# Patient Record
Sex: Female | Born: 1960 | Race: White | Hispanic: No | Marital: Married | Smoking: Former smoker
Health system: Southern US, Community
[De-identification: ages and names within clinical notes are randomized; demographics above are authoritative.]

## PROBLEM LIST (undated history)

## (undated) DIAGNOSIS — R112 Nausea with vomiting, unspecified: Secondary | ICD-10-CM

## (undated) DIAGNOSIS — E559 Vitamin D deficiency, unspecified: Secondary | ICD-10-CM

## (undated) DIAGNOSIS — E669 Obesity, unspecified: Secondary | ICD-10-CM

## (undated) DIAGNOSIS — M1990 Unspecified osteoarthritis, unspecified site: Secondary | ICD-10-CM

## (undated) DIAGNOSIS — F419 Anxiety disorder, unspecified: Secondary | ICD-10-CM

## (undated) DIAGNOSIS — G4730 Sleep apnea, unspecified: Secondary | ICD-10-CM

## (undated) DIAGNOSIS — I2510 Atherosclerotic heart disease of native coronary artery without angina pectoris: Secondary | ICD-10-CM

## (undated) DIAGNOSIS — F32A Depression, unspecified: Secondary | ICD-10-CM

## (undated) DIAGNOSIS — D649 Anemia, unspecified: Secondary | ICD-10-CM

## (undated) DIAGNOSIS — T8859XA Other complications of anesthesia, initial encounter: Secondary | ICD-10-CM

## (undated) DIAGNOSIS — I10 Essential (primary) hypertension: Secondary | ICD-10-CM

## (undated) DIAGNOSIS — J45909 Unspecified asthma, uncomplicated: Secondary | ICD-10-CM

## (undated) DIAGNOSIS — N189 Chronic kidney disease, unspecified: Secondary | ICD-10-CM

## (undated) DIAGNOSIS — K219 Gastro-esophageal reflux disease without esophagitis: Secondary | ICD-10-CM

## (undated) HISTORY — PX: ABDOMINAL HYSTERECTOMY: SHX81

## (undated) HISTORY — PX: TUBAL LIGATION: SHX77

## (undated) HISTORY — PX: COLONOSCOPY: SHX174

## (undated) HISTORY — PX: CHOLECYSTECTOMY: SHX55

## (undated) HISTORY — PX: LAPAROSCOPIC GASTRIC SLEEVE RESECTION: SHX5895

---

## 2019-01-14 ENCOUNTER — Encounter (HOSPITAL_COMMUNITY)
Admission: EM | Disposition: A | Discharge status: Home / Self Care | Payer: Self-pay | Source: Home / Self Care | Attending: Emergency Medicine

## 2019-01-14 ENCOUNTER — Ambulatory Visit (HOSPITAL_COMMUNITY)
Admission: EM | Admit: 2019-01-14 | Discharge: 2019-01-15 | Disposition: A | Discharge status: Home / Self Care | Payer: Managed Care, Other (non HMO) | Attending: Emergency Medicine | Admitting: Emergency Medicine

## 2019-01-14 ENCOUNTER — Encounter (HOSPITAL_COMMUNITY): Payer: Self-pay | Admitting: *Deleted

## 2019-01-14 ENCOUNTER — Emergency Department (HOSPITAL_COMMUNITY): Payer: Managed Care, Other (non HMO) | Admitting: Certified Registered"

## 2019-01-14 ENCOUNTER — Emergency Department (HOSPITAL_COMMUNITY): Payer: Managed Care, Other (non HMO)

## 2019-01-14 DIAGNOSIS — F329 Major depressive disorder, single episode, unspecified: Secondary | ICD-10-CM | POA: Insufficient documentation

## 2019-01-14 DIAGNOSIS — S52601B Unspecified fracture of lower end of right ulna, initial encounter for open fracture type I or II: Secondary | ICD-10-CM | POA: Diagnosis not present

## 2019-01-14 DIAGNOSIS — Z6841 Body Mass Index (BMI) 40.0 and over, adult: Secondary | ICD-10-CM | POA: Diagnosis not present

## 2019-01-14 DIAGNOSIS — S52571B Other intraarticular fracture of lower end of right radius, initial encounter for open fracture type I or II: Secondary | ICD-10-CM | POA: Insufficient documentation

## 2019-01-14 DIAGNOSIS — Z23 Encounter for immunization: Secondary | ICD-10-CM | POA: Diagnosis not present

## 2019-01-14 DIAGNOSIS — S56221A Laceration of other flexor muscle, fascia and tendon at forearm level, right arm, initial encounter: Secondary | ICD-10-CM | POA: Insufficient documentation

## 2019-01-14 DIAGNOSIS — M25531 Pain in right wrist: Secondary | ICD-10-CM | POA: Diagnosis present

## 2019-01-14 DIAGNOSIS — S5401XA Injury of ulnar nerve at forearm level, right arm, initial encounter: Secondary | ICD-10-CM | POA: Insufficient documentation

## 2019-01-14 DIAGNOSIS — S6411XA Injury of median nerve at wrist and hand level of right arm, initial encounter: Secondary | ICD-10-CM | POA: Insufficient documentation

## 2019-01-14 DIAGNOSIS — S52501B Unspecified fracture of the lower end of right radius, initial encounter for open fracture type I or II: Secondary | ICD-10-CM

## 2019-01-14 DIAGNOSIS — S62101B Fracture of unspecified carpal bone, right wrist, initial encounter for open fracture: Secondary | ICD-10-CM

## 2019-01-14 HISTORY — PX: I & D EXTREMITY: SHX5045

## 2019-01-14 HISTORY — PX: ORIF WRIST FRACTURE: SHX2133

## 2019-01-14 HISTORY — DX: Obesity, unspecified: E66.9

## 2019-01-14 LAB — COMPREHENSIVE METABOLIC PANEL
ALT: 14 U/L (ref 0–44)
AST: 19 U/L (ref 15–41)
Albumin: 3 g/dL — ABNORMAL LOW (ref 3.5–5.0)
Alkaline Phosphatase: 66 U/L (ref 38–126)
Anion gap: 10 (ref 5–15)
BUN: 18 mg/dL (ref 6–20)
CO2: 24 mmol/L (ref 22–32)
Calcium: 8.6 mg/dL — ABNORMAL LOW (ref 8.9–10.3)
Chloride: 109 mmol/L (ref 98–111)
Creatinine, Ser: 1.16 mg/dL — ABNORMAL HIGH (ref 0.44–1.00)
GFR calc Af Amer: >60 mL/min (ref >60)
GFR calc non Af Amer: 52 mL/min — ABNORMAL LOW (ref >60)
Glucose, Bld: 141 mg/dL — ABNORMAL HIGH (ref 70–99)
Potassium: 4.1 mmol/L (ref 3.5–5.1)
Sodium: 143 mmol/L (ref 135–145)
TOTAL PROTEIN: 6.1 g/dL — AB (ref 6.5–8.1)
Total Bilirubin: 0.8 mg/dL (ref 0.3–1.2)

## 2019-01-14 LAB — CBC
HCT: 44.3 % (ref 36.0–46.0)
Hemoglobin: 14 g/dL (ref 12.0–15.0)
MCH: 28.7 pg (ref 26.0–34.0)
MCHC: 31.6 g/dL (ref 30.0–36.0)
MCV: 91 fL (ref 80.0–100.0)
Platelets: 294 10*3/uL (ref 150–400)
RBC: 4.87 MIL/uL (ref 3.87–5.11)
RDW: 13.2 % (ref 11.5–15.5)
WBC: 10.9 10*3/uL — ABNORMAL HIGH (ref 4.0–10.5)
nRBC: 0 % (ref 0.0–0.2)

## 2019-01-14 LAB — ETHANOL

## 2019-01-14 LAB — SAMPLE TO BLOOD BANK

## 2019-01-14 LAB — PROTIME-INR
INR: 0.93
Prothrombin Time: 12.4 seconds (ref 11.4–15.2)

## 2019-01-14 LAB — LACTIC ACID, PLASMA: Lactic Acid, Venous: 2.8 mmol/L (ref 0.5–1.9)

## 2019-01-14 SURGERY — OPEN REDUCTION INTERNAL FIXATION (ORIF) WRIST FRACTURE
Anesthesia: Regional | Site: Wrist | Laterality: Right

## 2019-01-14 MED ORDER — PROPOFOL 10 MG/ML IV BOLUS
INTRAVENOUS | Status: DC | PRN
  Administered 2019-01-14: 170 mg via INTRAVENOUS

## 2019-01-14 MED ORDER — BUPIVACAINE-EPINEPHRINE (PF) 0.5% -1:200000 IJ SOLN
INTRAMUSCULAR | Status: DC | PRN
  Administered 2019-01-14: 30 mL via PERINEURAL

## 2019-01-14 MED ORDER — HYDROMORPHONE HCL 1 MG/ML IJ SOLN
1.0000 mg | Freq: Once | INTRAMUSCULAR | Status: AC
  Administered 2019-01-14: 1 mg via INTRAVENOUS
  Filled 2019-01-14: qty 1

## 2019-01-14 MED ORDER — PHENYLEPHRINE HCL 10 MG/ML IJ SOLN
INTRAMUSCULAR | Status: DC | PRN
  Administered 2019-01-14 (×3): 120 ug via INTRAVENOUS

## 2019-01-14 MED ORDER — FENTANYL CITRATE (PF) 100 MCG/2ML IJ SOLN: 25.0000 ug | INTRAMUSCULAR | Status: DC | PRN

## 2019-01-14 MED ORDER — TETANUS-DIPHTH-ACELL PERTUSSIS 5-2.5-18.5 LF-MCG/0.5 IM SUSP
0.5000 mL | Freq: Once | INTRAMUSCULAR | Status: AC
  Administered 2019-01-14: 0.5 mL via INTRAMUSCULAR
  Filled 2019-01-14: qty 0.5

## 2019-01-14 MED ORDER — FENTANYL CITRATE (PF) 250 MCG/5ML IJ SOLN
INTRAMUSCULAR | Status: AC
  Filled 2019-01-14: qty 5

## 2019-01-14 MED ORDER — OXYCODONE HCL 5 MG/5ML PO SOLN: 5.0000 mg | Freq: Once | ORAL | Status: DC | PRN

## 2019-01-14 MED ORDER — CEFAZOLIN SODIUM-DEXTROSE 2-4 GM/100ML-% IV SOLN
2.0000 g | Freq: Once | INTRAVENOUS | Status: AC
  Administered 2019-01-14: 2 g via INTRAVENOUS

## 2019-01-14 MED ORDER — GLYCOPYRROLATE 0.2 MG/ML IJ SOLN
INTRAMUSCULAR | Status: DC | PRN
  Administered 2019-01-14: 0.2 mg via INTRAVENOUS

## 2019-01-14 MED ORDER — OXYCODONE HCL 5 MG PO TABS: 5.0000 mg | ORAL_TABLET | Freq: Once | ORAL | Status: DC | PRN

## 2019-01-14 MED ORDER — LACTATED RINGERS IV BOLUS
1500.0000 mL | Freq: Once | INTRAVENOUS | Status: AC
  Administered 2019-01-14: 1500 mL via INTRAVENOUS

## 2019-01-14 MED ORDER — EPHEDRINE SULFATE-NACL 50-0.9 MG/10ML-% IV SOSY
PREFILLED_SYRINGE | INTRAVENOUS | Status: DC | PRN
  Administered 2019-01-14 (×3): 10 mg via INTRAVENOUS

## 2019-01-14 MED ORDER — SUCCINYLCHOLINE CHLORIDE 200 MG/10ML IV SOSY
PREFILLED_SYRINGE | INTRAVENOUS | Status: DC | PRN
  Administered 2019-01-14: 120 mg via INTRAVENOUS

## 2019-01-14 MED ORDER — LACTATED RINGERS IV SOLN
INTRAVENOUS | Status: DC
  Administered 2019-01-14 (×2): via INTRAVENOUS

## 2019-01-14 MED ORDER — CEFAZOLIN SODIUM-DEXTROSE 2-4 GM/100ML-% IV SOLN
2.0000 g | Freq: Once | INTRAVENOUS | Status: AC
  Administered 2019-01-14: 2 g via INTRAVENOUS
  Filled 2019-01-14: qty 100

## 2019-01-14 MED ORDER — LIDOCAINE 2% (20 MG/ML) 5 ML SYRINGE
INTRAMUSCULAR | Status: DC | PRN
  Administered 2019-01-14: 60 mg via INTRAVENOUS

## 2019-01-14 MED ORDER — LACTATED RINGERS IV BOLUS: 1000.0000 mL | Freq: Once | INTRAVENOUS | Status: DC

## 2019-01-14 MED ORDER — MIDAZOLAM HCL 2 MG/2ML IJ SOLN
INTRAMUSCULAR | Status: AC
  Filled 2019-01-14: qty 2

## 2019-01-14 MED ORDER — PROPOFOL 10 MG/ML IV BOLUS
INTRAVENOUS | Status: AC
  Filled 2019-01-14: qty 20

## 2019-01-14 MED ORDER — SODIUM CHLORIDE 0.9 % IV SOLN
INTRAVENOUS | Status: DC | PRN
  Administered 2019-01-14: 50 ug/min via INTRAVENOUS

## 2019-01-14 MED ORDER — ONDANSETRON HCL 4 MG/2ML IJ SOLN
INTRAMUSCULAR | Status: DC | PRN
  Administered 2019-01-14: 4 mg via INTRAVENOUS

## 2019-01-14 MED ORDER — 0.9 % SODIUM CHLORIDE (POUR BTL) OPTIME
TOPICAL | Status: DC | PRN
  Administered 2019-01-14 (×2): 1000 mL

## 2019-01-14 MED ORDER — ONDANSETRON HCL 4 MG/2ML IJ SOLN
4.0000 mg | Freq: Four times a day (QID) | INTRAMUSCULAR | Status: AC | PRN
  Administered 2019-01-15: 4 mg via INTRAVENOUS

## 2019-01-14 MED ORDER — DULOXETINE HCL 30 MG PO CPEP: 30.0000 mg | ORAL_CAPSULE | Freq: Every day | ORAL | Status: DC

## 2019-01-14 MED ORDER — FENTANYL CITRATE (PF) 100 MCG/2ML IJ SOLN
INTRAMUSCULAR | Status: DC | PRN
  Administered 2019-01-14: 25 ug via INTRAVENOUS

## 2019-01-14 MED ORDER — BUPIVACAINE HCL (PF) 0.25 % IJ SOLN
INTRAMUSCULAR | Status: AC
  Filled 2019-01-14: qty 30

## 2019-01-14 MED ORDER — MIDAZOLAM HCL 5 MG/5ML IJ SOLN
INTRAMUSCULAR | Status: DC | PRN
  Administered 2019-01-14: 2 mg via INTRAVENOUS

## 2019-01-14 SURGICAL SUPPLY — 72 items
BANDAGE ACE 3X5.8 VEL STRL LF (GAUZE/BANDAGES/DRESSINGS) ×8 IMPLANT
BANDAGE ACE 4X5 VEL STRL LF (GAUZE/BANDAGES/DRESSINGS) ×6 IMPLANT
BIT DRILL 2.2 SS TIBIAL (BIT) ×2 IMPLANT
BLADE CLIPPER SURG (BLADE) IMPLANT
BNDG ESMARK 4X9 LF (GAUZE/BANDAGES/DRESSINGS) ×2 IMPLANT
BNDG GAUZE ELAST 4 BULKY (GAUZE/BANDAGES/DRESSINGS) ×2 IMPLANT
CORDS BIPOLAR (ELECTRODE) ×2 IMPLANT
COVER SURGICAL LIGHT HANDLE (MISCELLANEOUS) ×2 IMPLANT
COVER WAND RF STERILE (DRAPES) ×2 IMPLANT
CUFF TOURNIQUET SINGLE 18IN (TOURNIQUET CUFF) IMPLANT
CUFF TOURNIQUET SINGLE 24IN (TOURNIQUET CUFF) ×2 IMPLANT
DRAIN TLS ROUND 10FR (DRAIN) IMPLANT
DRAPE OEC MINIVIEW 54X84 (DRAPES) ×2 IMPLANT
DRAPE SURG 17X11 SM STRL (DRAPES) ×2 IMPLANT
DRIVER BIT SQUARE 1.7/2.2 (TRAUMA) ×4 IMPLANT
DRSG ADAPTIC 3X8 NADH LF (GAUZE/BANDAGES/DRESSINGS) ×2 IMPLANT
ELECT REM PT RETURN 9FT ADLT (ELECTROSURGICAL)
ELECTRODE REM PT RTRN 9FT ADLT (ELECTROSURGICAL) IMPLANT
GAUZE SPONGE 4X4 12PLY STRL (GAUZE/BANDAGES/DRESSINGS) ×2 IMPLANT
GAUZE XEROFORM 5X9 LF (GAUZE/BANDAGES/DRESSINGS) ×2 IMPLANT
GLOVE BIOGEL PI IND STRL 8.5 (GLOVE) ×1 IMPLANT
GLOVE BIOGEL PI INDICATOR 8.5 (GLOVE) ×1
GLOVE SURG ORTHO 8.0 STRL STRW (GLOVE) ×2 IMPLANT
GOWN STRL REUS W/ TWL LRG LVL3 (GOWN DISPOSABLE) ×1 IMPLANT
GOWN STRL REUS W/ TWL XL LVL3 (GOWN DISPOSABLE) ×1 IMPLANT
GOWN STRL REUS W/TWL LRG LVL3 (GOWN DISPOSABLE) ×1
GOWN STRL REUS W/TWL XL LVL3 (GOWN DISPOSABLE) ×1
HOVERMATT SINGLE USE (MISCELLANEOUS) ×2 IMPLANT
K-WIRE 1.6 (WIRE) ×2
K-WIRE FX5X1.6XNS BN SS (WIRE) ×2
KIT BASIN OR (CUSTOM PROCEDURE TRAY) ×2 IMPLANT
KIT TURNOVER KIT B (KITS) ×2 IMPLANT
KWIRE FX5X1.6XNS BN SS (WIRE) ×2 IMPLANT
MANIFOLD NEPTUNE II (INSTRUMENTS) ×2 IMPLANT
NEEDLE HYPO 25X1 1.5 SAFETY (NEEDLE) IMPLANT
NS IRRIG 1000ML POUR BTL (IV SOLUTION) ×2 IMPLANT
PACK ORTHO EXTREMITY (CUSTOM PROCEDURE TRAY) ×2 IMPLANT
PAD ARMBOARD 7.5X6 YLW CONV (MISCELLANEOUS) ×4 IMPLANT
PAD CAST 3X4 CTTN HI CHSV (CAST SUPPLIES) ×2 IMPLANT
PAD CAST 4YDX4 CTTN HI CHSV (CAST SUPPLIES) IMPLANT
PADDING CAST COTTON 3X4 STRL (CAST SUPPLIES) ×2
PADDING CAST COTTON 4X4 STRL (CAST SUPPLIES)
PEG LOCKING SMOOTH 2.2X16 (Screw) ×2 IMPLANT
PEG LOCKING SMOOTH 2.2X18 (Peg) ×4 IMPLANT
PEG LOCKING SMOOTH 2.2X22 (Screw) ×2 IMPLANT
PLATE RIGHT VOLAR RIM DVR (Plate) ×2 IMPLANT
SCREW LOCK 14X2.7X 3 LD TPR (Screw) ×3 IMPLANT
SCREW LOCK 16X2.7X 3 LD TPR (Screw) ×2 IMPLANT
SCREW LOCK 20X2.7X 3 LD TPR (Screw) ×1 IMPLANT
SCREW LOCK 22X2.7X 3 LD TPR (Screw) ×1 IMPLANT
SCREW LOCK 24X2.7X3 LD THRD (Screw) ×1 IMPLANT
SCREW LOCKING 2.7X14 (Screw) ×3 IMPLANT
SCREW LOCKING 2.7X16 (Screw) ×2 IMPLANT
SCREW LOCKING 2.7X20MM (Screw) ×1 IMPLANT
SCREW LOCKING 2.7X22MM (Screw) ×1 IMPLANT
SCREW LOCKING 2.7X24MM (Screw) ×1 IMPLANT
SLING ARM FOAM STRAP XLG (SOFTGOODS) ×4 IMPLANT
SOAP 2 % CHG 4 OZ (WOUND CARE) IMPLANT
SPLINT FIBERGLASS 3X35 (CAST SUPPLIES) ×2 IMPLANT
SUT FIBERWIRE 4-0 18 TAPR NDL (SUTURE) ×2
SUT MNCRL AB 3-0 PS2 18 (SUTURE) ×4 IMPLANT
SUT PROLENE 3 0 PS 2 (SUTURE) ×4 IMPLANT
SUT PROLENE 4 0 PS 2 18 (SUTURE) IMPLANT
SUT VIC AB 2-0 FS1 27 (SUTURE) IMPLANT
SUT VICRYL 4-0 PS2 18IN ABS (SUTURE) IMPLANT
SUTURE FIBERWR 4-0 18 TAPR NDL (SUTURE) ×1 IMPLANT
SYR CONTROL 10ML LL (SYRINGE) IMPLANT
SYSTEM CHEST DRAIN TLS 7FR (DRAIN) IMPLANT
TOWEL OR 17X24 6PK STRL BLUE (TOWEL DISPOSABLE) ×2 IMPLANT
TOWEL OR 17X26 10 PK STRL BLUE (TOWEL DISPOSABLE) ×2 IMPLANT
TUBE CONNECTING 12X1/4 (SUCTIONS) ×2 IMPLANT
WATER STERILE IRR 1000ML POUR (IV SOLUTION) IMPLANT

## 2019-01-14 NOTE — Anesthesia Preprocedure Evaluation (Signed)
Anesthesia Evaluation  Patient identified by MRN, date of birth, ID band Patient awake    Reviewed: Allergy & Precautions, H&P , NPO status , Patient's Chart, lab work & pertinent test results  Airway Mallampati: II   Neck ROM: full    Dental   Pulmonary neg pulmonary ROS,    breath sounds clear to auscultation       Cardiovascular negative cardio ROS   Rhythm:regular Rate:Normal     Neuro/Psych    GI/Hepatic   Endo/Other  Morbid obesity  Renal/GU      Musculoskeletal   Abdominal   Peds  Hematology   Anesthesia Other Findings   Reproductive/Obstetrics                             Anesthesia Physical Anesthesia Plan  ASA: II  Anesthesia Plan: General and Regional   Post-op Pain Management:  Regional for Post-op pain   Induction: Intravenous  PONV Risk Score and Plan: 3 and Ondansetron, Dexamethasone, Midazolam and Treatment may vary due to age or medical condition  Airway Management Planned: Oral ETT  Additional Equipment:   Intra-op Plan:   Post-operative Plan: Extubation in OR  Informed Consent: I have reviewed the patients History and Physical, chart, labs and discussed the procedure including the risks, benefits and alternatives for the proposed anesthesia with the patient or authorized representative who has indicated his/her understanding and acceptance.       Plan Discussed with: CRNA, Anesthesiologist and Surgeon  Anesthesia Plan Comments:         Anesthesia Quick Evaluation

## 2019-01-14 NOTE — Transfer of Care (Signed)
Immediate Anesthesia Transfer of Care Note  Patient: Megan Wang  Procedure(s) Performed: OPEN REDUCTION INTERNAL FIXATION (ORIF) WRIST FRACTURE (Right Wrist) IRRIGATION AND DEBRIDEMENT OF RIGHT WRIST FRACTURE (Right Wrist)  Patient Location: PACU  Anesthesia Type:General  Level of Consciousness: awake, alert  and oriented  Airway & Oxygen Therapy: Patient Spontanous Breathing  Post-op Assessment: Report given to RN and Post -op Vital signs reviewed and stable  Post vital signs: Reviewed and stable  Last Vitals:  Vitals Value Taken Time  BP 128/79 01/14/2019 11:42 PM  Temp 36.7 C 01/14/2019 11:42 PM  Pulse 103 01/14/2019 11:44 PM  Resp 15 01/14/2019 11:44 PM  SpO2 93 % 01/14/2019 11:44 PM  Vitals shown include unvalidated device data.  Last Pain:  Vitals:   01/14/19 1948  TempSrc: Tympanic  PainSc:          Complications: No apparent anesthesia complications
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
## 2019-01-14 NOTE — Anesthesia Procedure Notes (Signed)
Procedure Name: Intubation Date/Time: 01/14/2019 9:35 PM Performed by: Babs Bertin, CRNA Pre-anesthesia Checklist: Patient identified, Emergency Drugs available, Suction available and Patient being monitored Patient Re-evaluated:Patient Re-evaluated prior to induction Oxygen Delivery Method: Circle System Utilized Preoxygenation: Pre-oxygenation with 100% oxygen Induction Type: IV induction, Rapid sequence and Cricoid Pressure applied Laryngoscope Size: Mac Grade View: Grade I Tube type: Oral Tube size: 7.5 mm Number of attempts: 1 Airway Equipment and Method: Stylet and Oral airway Placement Confirmation: ETT inserted through vocal cords under direct vision,  positive ETCO2 and breath sounds checked- equal and bilateral Secured at: 21 cm Tube secured with: Tape Dental Injury: Teeth and Oropharynx as per pre-operative assessment

## 2019-01-14 NOTE — Anesthesia Procedure Notes (Signed)
Anesthesia Regional Block: Supraclavicular block   Pre-Anesthetic Checklist: ,, timeout performed, Correct Patient, Correct Site, Correct Laterality, Correct Procedure, Correct Position, site marked, Risks and benefits discussed,  Surgical consent,  Pre-op evaluation,  At surgeon's request and post-op pain management  Laterality: Right  Prep: chloraprep       Needles:  Injection technique: Single-shot  Needle Type: Echogenic Stimulator Needle     Needle Length: 5cm  Needle Gauge: 22     Additional Needles:   Procedures:, nerve stimulator,,,,,,,   Nerve Stimulator or Paresthesia:  Response: biceps flexion, 0.45 mA,   Additional Responses:   Narrative:  Start time: 01/14/2019 9:17 PM End time: 01/14/2019 9:24 PM Injection made incrementally with aspirations every 5 mL.  Performed by: Personally  Anesthesiologist: Achille Rich, MD  Additional Notes: Functioning IV was confirmed and monitors were applied.  A 76mm 22ga Arrow echogenic stimulator needle was used. Sterile prep and drape,hand hygiene and sterile gloves were used.  Negative aspiration and negative test dose prior to incremental administration of local anesthetic. The patient tolerated the procedure well.  Ultrasound guidance: relevent anatomy identified, needle position confirmed, local anesthetic spread visualized around nerve(s), vascular puncture avoided.  Image printed for medical record.
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
## 2019-01-15 ENCOUNTER — Other Ambulatory Visit: Payer: Self-pay

## 2019-01-15 MED ORDER — ONDANSETRON HCL 4 MG/2ML IJ SOLN: 4.0000 mg | Freq: Four times a day (QID) | INTRAMUSCULAR | Status: DC | PRN

## 2019-01-15 MED ORDER — DOCUSATE SODIUM 100 MG PO CAPS
100.0000 mg | ORAL_CAPSULE | Freq: Two times a day (BID) | ORAL | Status: DC
  Administered 2019-01-15: 100 mg via ORAL
  Filled 2019-01-15: qty 1

## 2019-01-15 MED ORDER — HYDROMORPHONE HCL 1 MG/ML IJ SOLN: 0.5000 mg | INTRAMUSCULAR | Status: DC | PRN

## 2019-01-15 MED ORDER — CEPHALEXIN 500 MG PO CAPS: 500.0000 mg | ORAL_CAPSULE | Freq: Four times a day (QID) | ORAL | 0 refills | Status: AC

## 2019-01-15 MED ORDER — CEFAZOLIN SODIUM-DEXTROSE 1-4 GM/50ML-% IV SOLN: 1.0000 g | INTRAVENOUS | Status: DC

## 2019-01-15 MED ORDER — ADULT MULTIVITAMIN W/MINERALS CH
1.0000 | ORAL_TABLET | Freq: Every day | ORAL | Status: DC
  Administered 2019-01-15: 1 via ORAL
  Filled 2019-01-15: qty 1

## 2019-01-15 MED ORDER — OXYCODONE HCL 5 MG PO TABS: 5.0000 mg | ORAL_TABLET | ORAL | Status: DC | PRN

## 2019-01-15 MED ORDER — METHOCARBAMOL 1000 MG/10ML IJ SOLN: 500.0000 mg | Freq: Four times a day (QID) | INTRAVENOUS | Status: DC | PRN

## 2019-01-15 MED ORDER — VITAMIN C 500 MG PO TABS
1000.0000 mg | ORAL_TABLET | Freq: Every day | ORAL | Status: DC
  Administered 2019-01-15: 1000 mg via ORAL
  Filled 2019-01-15: qty 2

## 2019-01-15 MED ORDER — ONDANSETRON HCL 4 MG PO TABS: 4.0000 mg | ORAL_TABLET | Freq: Four times a day (QID) | ORAL | Status: DC | PRN

## 2019-01-15 MED ORDER — GENTAMICIN SULFATE 40 MG/ML IJ SOLN
7.0000 mg/kg | INTRAVENOUS | Status: DC
  Administered 2019-01-15: 610 mg via INTRAVENOUS
  Filled 2019-01-15: qty 15.25

## 2019-01-15 MED ORDER — METHOCARBAMOL 500 MG PO TABS: 500.0000 mg | ORAL_TABLET | Freq: Four times a day (QID) | ORAL | Status: DC | PRN

## 2019-01-15 MED ORDER — CEFAZOLIN SODIUM-DEXTROSE 1-4 GM/50ML-% IV SOLN
1.0000 g | Freq: Three times a day (TID) | INTRAVENOUS | Status: DC
  Administered 2019-01-15: 1 g via INTRAVENOUS
  Filled 2019-01-15 (×2): qty 50

## 2019-01-15 MED ORDER — HYDROCODONE-ACETAMINOPHEN 10-325 MG PO TABS: 1.0000 | ORAL_TABLET | ORAL | Status: DC | PRN

## 2019-01-15 MED ORDER — DIPHENHYDRAMINE HCL 25 MG PO CAPS: 25.0000 mg | ORAL_CAPSULE | Freq: Four times a day (QID) | ORAL | Status: DC | PRN

## 2019-01-15 MED ORDER — OXYCODONE-ACETAMINOPHEN 10-325 MG PO TABS: 1.0000 | ORAL_TABLET | ORAL | 0 refills | Status: DC | PRN

## 2019-01-15 MED ORDER — ONDANSETRON HCL 4 MG/2ML IJ SOLN
INTRAMUSCULAR | Status: AC
  Filled 2019-01-15: qty 2

## 2019-01-15 MED ORDER — DOCUSATE SODIUM 100 MG PO CAPS: 100.0000 mg | ORAL_CAPSULE | Freq: Two times a day (BID) | ORAL | 0 refills | Status: AC

## 2019-01-15 NOTE — Progress Notes (Signed)
Pt given prescription and discharge instructions. Instructions gone over with her and family present, answered all questions. All belongings gathered to be sent home.
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
## 2019-01-15 NOTE — Progress Notes (Signed)
Orthopedics Progress Note  Subjective: My hand is numb this morning. No pain at all.  Objective:  Vitals:   01/15/19 0040 01/15/19 0904  BP: (!) 145/68 129/60  Pulse: 91 92  Resp:  16  Temp: (!) 97.4 F (36.3 C) (!) 97.5 F (36.4 C)  SpO2: 95% 96%    General: Awake and alert  Musculoskeletal: right arm splinted and elevated. Fingers well perfused and warm No motor and no sensory  Lab Results  Component Value Date   WBC 10.9 (H) 01/14/2019   HGB 14.0 01/14/2019   HCT 44.3 01/14/2019   MCV 91.0 01/14/2019   PLT 294 01/14/2019       Component Value Date/Time   NA 143 01/14/2019 1934   K 4.1 01/14/2019 1934   CL 109 01/14/2019 1934   CO2 24 01/14/2019 1934   GLUCOSE 141 (H) 01/14/2019 1934   BUN 18 01/14/2019 1934   CREATININE 1.16 (H) 01/14/2019 1934   CALCIUM 8.6 (L) 01/14/2019 1934   GFRNONAA 52 (L) 01/14/2019 1934   GFRAA >60 01/14/2019 1934    Lab Results  Component Value Date   INR 0.93 01/14/2019    Assessment/Plan: POD #1 s/p Procedure(s): OPEN REDUCTION INTERNAL FIXATION (ORIF) WRIST FRACTURE IRRIGATION AND DEBRIDEMENT OF RIGHT WRIST FRACTURE Stable this morning. Continue arm elevation and abx.   Almedia BallsSteven R. Ranell PatrickNorris, MD 01/15/2019 9:34 AM

## 2019-01-15 NOTE — Plan of Care (Signed)
  Problem: Health Behavior/Discharge Planning: Goal: Ability to manage health-related needs will improve Outcome: Adequate for Discharge   Problem: Pain Managment: Goal: General experience of comfort will improve Outcome: Adequate for Discharge   Problem: Safety: Goal: Ability to remain free from injury will improve Outcome: Adequate for Discharge   

## 2019-01-15 NOTE — Discharge Instructions (Signed)
KEEP BANDAGE CLEAN AND DRY CALL OFFICE FOR F/U APPT [phone] in 10 days Dr Melvyn Novas [phone] KEEP HAND ELEVATED ABOVE HEART OK TO APPLY ICE TO OPERATIVE AREA CONTACT OFFICE IF ANY WORSENING PAIN OR CONCERNS.

## 2019-01-16 NOTE — Anesthesia Postprocedure Evaluation (Signed)
Anesthesia Post Note  Patient: Megan Wang  Procedure(s) Performed: OPEN REDUCTION INTERNAL FIXATION (ORIF) WRIST FRACTURE (Right Wrist) IRRIGATION AND DEBRIDEMENT OF RIGHT WRIST FRACTURE (Right Wrist)     Patient location during evaluation: PACU Anesthesia Type: Regional and General Level of consciousness: awake and alert Pain management: pain level controlled Vital Signs Assessment: post-procedure vital signs reviewed and stable Respiratory status: spontaneous breathing, nonlabored ventilation, respiratory function stable and patient connected to nasal cannula oxygen Cardiovascular status: blood pressure returned to baseline and stable Postop Assessment: no apparent nausea or vomiting Anesthetic complications: no    Last Vitals:  Vitals:   01/15/19 0040 01/15/19 0904  BP: (!) 145/68 129/60  Pulse: 91 92  Resp:  16  Temp: (!) 36.3 C (!) 36.4 C  SpO2: 95% 96%    Last Pain:  Vitals:   01/15/19 0950  TempSrc:   PainSc: 0-No pain                 Nuri Larmer S

## 2019-01-18 ENCOUNTER — Encounter (HOSPITAL_COMMUNITY): Payer: Self-pay | Admitting: Orthopedic Surgery
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
## 2023-01-15 HISTORY — PX: HERNIA REPAIR: SHX51

## 2024-11-02 ENCOUNTER — Encounter (HOSPITAL_COMMUNITY): Payer: Self-pay | Admitting: Oral Surgery

## 2024-11-02 ENCOUNTER — Other Ambulatory Visit: Payer: Self-pay

## 2024-11-02 NOTE — Progress Notes (Signed)
 SDW call  Patient was given pre-op instructions over the phone. Patient verbalized understanding of instructions provided.     PCP - Dr. Wanda Formosa Cardiologist - denies Pulmonary:    PPM/ICD - denies Device Orders - na Rep Notified - na   Chest x-ray - na EKG -  DOS, 11/04/2024 Stress Test - 02/09/2020 ECHO -  Cardiac Cath -   Sleep Study/sleep apnea/CPAP: OSA, CPAP is broken and does not use it  Non-diabetic  Blood Thinner Instructions: denies  Aspirin Instructions:denies   ERAS Protcol - NPO   Anesthesia review: Yes. CKD, OSA, HTN< CAD   Patient denies shortness of breath, fever, cough and chest pain over the phone call  Your procedure is scheduled on Friday November 04, 2024  Report to [facility] Main Entrance A at 0630   A.M., then check in with the Admitting office.  Call this number if you have problems the morning of surgery:  [phone]   If you have any questions prior to your surgery date call [phone]: Open Monday-Friday 8am-4pm If you experience any cold or flu symptoms such as cough, fever, chills, shortness of breath, etc. between now and your scheduled surgery, please notify us  at the above number    Remember:  Do not eat or drink after midnight the night before your surgery  Take these medicines the morning of surgery with A SIP OF WATER:  Cymbalta   As needed: Tylenol , albuterol  As of today, STOP taking any Aspirin (unless otherwise instructed by your surgeon) Aleve, Naproxen, Ibuprofen, Motrin, Advil, Goody's, BC's, all herbal medications, fish oil, and all vitamins.

## 2024-11-03 NOTE — Anesthesia Preprocedure Evaluation (Addendum)
 Anesthesia Evaluation  Patient identified by MRN, date of birth, ID band Patient awake    Reviewed: Allergy & Precautions, NPO status , Patient's Chart, lab work & pertinent test results  History of Anesthesia Complications (+) PONV and history of anesthetic complications  Airway Mallampati: II  TM Distance: >3 FB Neck ROM: Full    Dental no notable dental hx. (+) Edentulous Upper, Edentulous Lower   Pulmonary sleep apnea and Continuous Positive Airway Pressure Ventilation , former smoker   Pulmonary exam normal breath sounds clear to auscultation       Cardiovascular hypertension, Pt. on medications (-) angina + CAD  (-) Past MI Normal cardiovascular exam Rhythm:Regular Rate:Normal     Neuro/Psych  PSYCHIATRIC DISORDERS Anxiety Depression       GI/Hepatic ,GERD  ,,  Endo/Other    Class 4 obesity  Renal/GU Renal InsufficiencyRenal disease     Musculoskeletal  (+) Arthritis ,    Abdominal   Peds  Hematology   Anesthesia Other Findings All: amoxicillin   Reproductive/Obstetrics                              Anesthesia Physical Anesthesia Plan  ASA: 3  Anesthesia Plan: General   Post-op Pain Management: Tylenol  PO (pre-op)*   Induction: Intravenous  PONV Risk Score and Plan: 4 or greater and Treatment may vary due to age or medical condition, Midazolam  and Ondansetron   Airway Management Planned: Oral ETT and Nasal ETT  Additional Equipment: None  Intra-op Plan:   Post-operative Plan: Extubation in OR  Informed Consent: I have reviewed the patients History and Physical, chart, labs and discussed the procedure including the risks, benefits and alternatives for the proposed anesthesia with the patient or authorized representative who has indicated his/her understanding and acceptance.     Dental advisory given  Plan Discussed with: CRNA and Surgeon  Anesthesia Plan  Comments: (PAT note written 11/03/2024 by Allison Zelenak, PA-C.  )         Anesthesia Quick Evaluation

## 2024-11-03 NOTE — H&P (Signed)
 Date: October 24, 2024   Patient: Megan Wang  PID: [id number]  DOB: Nov 23, 1961  SEX: Female   Patient referred by DDS for alveoloplasty (UR and UL) and vestibuloplasty (UR).  CC: No pain. Wants dentures.   Past Medical History:  Asthma, Sleep Apnea, CPAP, Snoring, Anemia, Arthritis, Chronic Fatigue Syndrome, Night Sweats, Mental Health problems, Morbid Obesity, HTN    Medications: Cymbalta , Losartan    Allergies:     NKDA    Surgeries:   Hand surgery, Hysterectomy, gallbladder removal, C Section, Gastric Sleeve     Social History       Smoking:            Alcohol: Drug use:                             Exam: BMI 54.  Edentulous maxilla and mandible. Bony irregularity of right and left maxilla. Lack of posterior right maxillary vestibular depth.  No purulence, edema, fluctuance, trismus. Oral cancer screening negative. Pharynx clear. No lymphadenopathy.  Panorex: Taken pre extraction of remaining upper teeth.  Assessment: ASA 3. Maxillary and mandibular atrophy. Alveolar irregularity  maxillary alveolar contour. Lack of vestibular depth right maxilla.             Plan: Maxillary alveoloplasty, vestibuloplasty.    Hospital Day surgery.                 Rx: n               Risks and complications explained. Questions answered.   Glendia EMERSON Primrose, DMD

## 2024-11-03 NOTE — Progress Notes (Signed)
 Anesthesia Chart Review: Megan Wang  Case: [id number] Date/Time: 11/04/24 0850   Procedure: DENTAL RESTORATION/EXTRACTIONS   Anesthesia type: General   Pre-op diagnosis: DENTAL CARIES   [facility] ROOM 04 / [facility]   Surgeons: Sheryle Hamilton, DMD       DISCUSSION: Patient is a 63 year old female scheduled for the above procedure. Per H&P, maxillary alveoloplasty, vestibuloplasty planned.   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 Last BMI documented is 54.41.  Labs as of 09/05/2024 ([facility]) show glucose 100, BUN 18, creatinine 1.23, eGFR 49, sodium 139, potassium 4.3, total bilirubin 0.4, AST 20, ALT 15, WBC 10.9, hemoglobin 13, hematocrit 40.8, platelet count 313.  A1c on 12/18/2023 was 6.1%.  Appears PCP is following CKD.   No recent EKG seen.  Nonischemic stress test in 2021.  CAC 34, 50-75th percentile in 10/2023. She is not currently on statin therapy.  She denied shortness of breath and chest pain per PAT RN phone call.  She is followed regularly by PCP Dr. Herchel.    She is a same-day workup, so updated labs and EKG on arrival as indicated. Anesthesia team evaluation on the day of surgery.   VS: Ht 5' 4 (1.626 m)   Wt (!) 143.8 kg   BMI 54.41 kg/m  09/27/2024 VS Dr. Herchel ([facility]): Blood Pressure 135/73 09/27/2024 1:43 PM EDT  Pulse 87 09/27/2024 1:41 PM EDT  Temperature 36.6 C (97.9 F) 09/27/2024 1:41 PM EDT  Respiratory Rate - -  Oxygen Saturation 95% 09/27/2024 1:41 PM EDT  Inhaled Oxygen Concentration - -  Weight 146.7 kg (323 lb 8 oz) 09/27/2024 1:41 PM EDT  Height 162.6 cm (5' 4) 09/27/2024 1:41 PM EDT  Body Mass Index 55.53 09/27/2024 1:41 PM EDT    PROVIDERS: Herchel Reusing, MD is PCP  ([facility]) Curvin Bradley, NP is sleep medicine provider. Last visit 10/03/2024. Previously diagnosed with mild OSA. CPAP machine not currently working and arranging to get new supplies.    LABS: See DISCUSSION.    EKG: For day of surgery as indicated.    Per narrative by EKG done through [facility] on 02/08/2020: Normal sinus rhythm  Rightward axis  Low voltage QRS  Borderline ECG    CV: CT Cardiac Calcium Scoring 10/30/2023 ([facility]): IMPRESSION:  1. The Agatston CAC score is  34. This places the patient between the 50th and 75th percentile for age and gender based on the [facility] data base.  2. CAC is present in the left anterior descending.  Treatment Recommendations Per SCCT Guidelines (2018) provided below.  Adapted from CAC-DRS: Coronary Artery Calcium Data and Reporting system. An expert consensus document of the Society of Cardiovascular Computed Tomography.  http://www.hull-peters.com/.2018.03.008...  CAC Score 1-99  Risk: mildly increased  Treatment Recommendation: moderate intensity statin...    Nuclear stress test 02/08/2020 ([facility]): CONCLUSIONS:  Normal gated SPECT myocardial perfusion imaging study with IV regadenoson. There is no evidence of myocardial ischemia or infarction.  Left ventricular cavity size was normal. Left ventricular systolic function is normal.  Left ventricular wall motion is normal.  The left ventricular ejection fraction assessed as >65%.    Past Medical History:  Diagnosis Date   Anemia    Anxiety    Arthritis    Asthma    Chronic kidney disease  Complication of anesthesia    Coronary artery disease involving native coronary artery of native heart without angina pectoris    Depression    GERD (gastroesophageal reflux disease)    Hypertension    Obesity    PONV (postoperative nausea and vomiting)    Sleep apnea    Has CPAP, but it is currently broken   Vitamin D deficiency     Past Surgical History:  Procedure  Laterality Date   ABDOMINAL HYSTERECTOMY     CESAREAN SECTION     CHOLECYSTECTOMY     COLONOSCOPY     HERNIA REPAIR  01/2023   I & D EXTREMITY Right 01/14/2019   Procedure: IRRIGATION AND DEBRIDEMENT OF RIGHT WRIST FRACTURE;  Surgeon: Shari Easter, MD;  Location: [facility] OR;  Service: Orthopedics;  Laterality: Right;   LAPAROSCOPIC GASTRIC SLEEVE RESECTION     ORIF WRIST FRACTURE Right 01/14/2019   Procedure: OPEN REDUCTION INTERNAL FIXATION (ORIF) WRIST FRACTURE;  Surgeon: Shari Easter, MD;  Location: [facility] OR;  Service: Orthopedics;  Laterality: Right;   TUBAL LIGATION      MEDICATIONS: No current facility-administered medications for this encounter.    acetaminophen  (TYLENOL ) 650 MG CR tablet   albuterol (VENTOLIN HFA) 108 (90 Base) MCG/ACT inhaler   calcium carbonate (TUMS - DOSED IN MG ELEMENTAL CALCIUM) 500 MG chewable tablet   cyanocobalamin (,VITAMIN B-12,) 1000 MCG/ML injection   diclofenac Sodium (VOLTAREN) 1 % GEL   DULoxetine  (CYMBALTA ) 30 MG capsule   losartan (COZAAR) 25 MG tablet   Multiple Vitamins-Iron (MULTIVITAMINS WITH IRON) TABS tablet   Vitamin D, Ergocalciferol, (DRISDOL) 1.25 MG (50000 UT) CAPS capsule   docusate sodium  (COLACE) 100 MG capsule    Isaiah Ruder, PA-C Surgical Short Stay/Anesthesiology [facility] Phone [phone] [facility] Phone [phone] 11/03/2024 1:51 PM

## 2024-11-04 ENCOUNTER — Encounter (HOSPITAL_COMMUNITY): Payer: Self-pay | Admitting: Oral Surgery

## 2024-11-04 ENCOUNTER — Other Ambulatory Visit: Payer: Self-pay

## 2024-11-04 ENCOUNTER — Ambulatory Visit (HOSPITAL_COMMUNITY): Payer: Self-pay | Admitting: Vascular Surgery

## 2024-11-04 ENCOUNTER — Ambulatory Visit (HOSPITAL_COMMUNITY)
Admission: RE | Admit: 2024-11-04 | Discharge: 2024-11-04 | Disposition: A | Discharge status: Home / Self Care | Attending: Oral Surgery | Admitting: Oral Surgery

## 2024-11-04 ENCOUNTER — Encounter (HOSPITAL_COMMUNITY): Admission: RE | Disposition: A | Payer: Self-pay | Source: Home / Self Care | Attending: Oral Surgery

## 2024-11-04 DIAGNOSIS — I2510 Atherosclerotic heart disease of native coronary artery without angina pectoris: Secondary | ICD-10-CM

## 2024-11-04 DIAGNOSIS — M1990 Unspecified osteoarthritis, unspecified site: Secondary | ICD-10-CM | POA: Diagnosis not present

## 2024-11-04 DIAGNOSIS — Z87891 Personal history of nicotine dependence: Secondary | ICD-10-CM

## 2024-11-04 DIAGNOSIS — I129 Hypertensive chronic kidney disease with stage 1 through stage 4 chronic kidney disease, or unspecified chronic kidney disease: Secondary | ICD-10-CM | POA: Insufficient documentation

## 2024-11-04 DIAGNOSIS — I10 Essential (primary) hypertension: Secondary | ICD-10-CM

## 2024-11-04 DIAGNOSIS — K0820 Unspecified atrophy of edentulous alveolar ridge: Secondary | ICD-10-CM | POA: Diagnosis present

## 2024-11-04 DIAGNOSIS — N189 Chronic kidney disease, unspecified: Secondary | ICD-10-CM | POA: Insufficient documentation

## 2024-11-04 DIAGNOSIS — K029 Dental caries, unspecified: Secondary | ICD-10-CM | POA: Diagnosis not present

## 2024-11-04 DIAGNOSIS — Z6841 Body Mass Index (BMI) 40.0 and over, adult: Secondary | ICD-10-CM | POA: Insufficient documentation

## 2024-11-04 DIAGNOSIS — E6689 Other obesity not elsewhere classified: Secondary | ICD-10-CM | POA: Diagnosis not present

## 2024-11-04 DIAGNOSIS — G4730 Sleep apnea, unspecified: Secondary | ICD-10-CM | POA: Diagnosis not present

## 2024-11-04 DIAGNOSIS — K219 Gastro-esophageal reflux disease without esophagitis: Secondary | ICD-10-CM | POA: Diagnosis not present

## 2024-11-04 HISTORY — DX: Anxiety disorder, unspecified: F41.9

## 2024-11-04 HISTORY — DX: Vitamin D deficiency, unspecified: E55.9

## 2024-11-04 HISTORY — DX: Sleep apnea, unspecified: G47.30

## 2024-11-04 HISTORY — DX: Unspecified asthma, uncomplicated: J45.909

## 2024-11-04 HISTORY — DX: Other complications of anesthesia, initial encounter: T88.59XA

## 2024-11-04 HISTORY — DX: Atherosclerotic heart disease of native coronary artery without angina pectoris: I25.10

## 2024-11-04 HISTORY — DX: Nausea with vomiting, unspecified: R11.2

## 2024-11-04 HISTORY — DX: Chronic kidney disease, unspecified: N18.9

## 2024-11-04 HISTORY — DX: Gastro-esophageal reflux disease without esophagitis: K21.9

## 2024-11-04 HISTORY — DX: Anemia, unspecified: D64.9

## 2024-11-04 HISTORY — DX: Depression, unspecified: F32.A

## 2024-11-04 HISTORY — DX: Unspecified osteoarthritis, unspecified site: M19.90

## 2024-11-04 HISTORY — PX: TOOTH EXTRACTION: SHX859

## 2024-11-04 HISTORY — DX: Essential (primary) hypertension: I10

## 2024-11-04 LAB — BASIC METABOLIC PANEL WITH GFR
Anion gap: 11 (ref 5–15)
BUN: 15 mg/dL (ref 8–23)
CO2: 25 mmol/L (ref 22–32)
Calcium: 9 mg/dL (ref 8.9–10.3)
Chloride: 103 mmol/L (ref 98–111)
Creatinine, Ser: 1.08 mg/dL — ABNORMAL HIGH (ref 0.44–1.00)
GFR, Estimated: 57 mL/min — ABNORMAL LOW (ref >60)
Glucose, Bld: 108 mg/dL — ABNORMAL HIGH (ref 70–99)
Potassium: 3.9 mmol/L (ref 3.5–5.1)
Sodium: 139 mmol/L (ref 135–145)

## 2024-11-04 LAB — CBC
HCT: 37.6 % (ref 36.0–46.0)
Hemoglobin: 12 g/dL (ref 12.0–15.0)
MCH: 29.1 pg (ref 26.0–34.0)
MCHC: 31.9 g/dL (ref 30.0–36.0)
MCV: 91 fL (ref 80.0–100.0)
Platelets: 265 K/uL (ref 150–400)
RBC: 4.13 MIL/uL (ref 3.87–5.11)
RDW: 15 % (ref 11.5–15.5)
WBC: 7.7 K/uL (ref 4.0–10.5)
nRBC: 0 % (ref 0.0–0.2)

## 2024-11-04 SURGERY — DENTAL RESTORATION/EXTRACTIONS
Anesthesia: General

## 2024-11-04 MED ORDER — CLINDAMYCIN HCL 300 MG PO CAPS: 300.0000 mg | ORAL_CAPSULE | Freq: Three times a day (TID) | ORAL | 0 refills | Status: AC

## 2024-11-04 MED ORDER — LIDOCAINE 2% (20 MG/ML) 5 ML SYRINGE
INTRAMUSCULAR | Status: DC | PRN
  Administered 2024-11-04: 100 mg via INTRAVENOUS

## 2024-11-04 MED ORDER — CHLORHEXIDINE GLUCONATE 0.12 % MT SOLN: 15.0000 mL | Freq: Once | OROMUCOSAL | Status: AC

## 2024-11-04 MED ORDER — CHLORHEXIDINE GLUCONATE 0.12 % MT SOLN: 15.0000 mL | Freq: Two times a day (BID) | OROMUCOSAL | 0 refills | Status: AC

## 2024-11-04 MED ORDER — SUGAMMADEX SODIUM 200 MG/2ML IV SOLN
INTRAVENOUS | Status: DC | PRN
  Administered 2024-11-04: 300 mg via INTRAVENOUS

## 2024-11-04 MED ORDER — CEFAZOLIN SODIUM-DEXTROSE 3-4 GM/150ML-% IV SOLN
3.0000 g | INTRAVENOUS | Status: AC
  Administered 2024-11-04: 3 g via INTRAVENOUS
  Filled 2024-11-04: qty 150

## 2024-11-04 MED ORDER — MIDAZOLAM HCL 2 MG/2ML IJ SOLN
INTRAMUSCULAR | Status: AC
  Filled 2024-11-04: qty 2

## 2024-11-04 MED ORDER — ROCURONIUM BROMIDE 10 MG/ML (PF) SYRINGE
PREFILLED_SYRINGE | INTRAVENOUS | Status: DC | PRN
  Administered 2024-11-04: 80 mg via INTRAVENOUS

## 2024-11-04 MED ORDER — PROPOFOL 10 MG/ML IV BOLUS
INTRAVENOUS | Status: DC | PRN
  Administered 2024-11-04: 200 mg via INTRAVENOUS

## 2024-11-04 MED ORDER — EPHEDRINE SULFATE-NACL 50-0.9 MG/10ML-% IV SOSY
PREFILLED_SYRINGE | INTRAVENOUS | Status: DC | PRN
Start: 2024-11-04 — End: 2024-11-04
  Administered 2024-11-04 (×2): 5 mg via INTRAVENOUS

## 2024-11-04 MED ORDER — CHLORHEXIDINE GLUCONATE 0.12 % MT SOLN
OROMUCOSAL | Status: AC
  Administered 2024-11-04: 15 mL via OROMUCOSAL
  Filled 2024-11-04: qty 15

## 2024-11-04 MED ORDER — GLYCOPYRROLATE 0.2 MG/ML IJ SOLN
INTRAMUSCULAR | Status: DC | PRN
  Administered 2024-11-04: .2 mg via INTRAVENOUS

## 2024-11-04 MED ORDER — LIDOCAINE-EPINEPHRINE 2 %-1:100000 IJ SOLN
INTRAMUSCULAR | Status: AC
  Filled 2024-11-04: qty 1

## 2024-11-04 MED ORDER — AMISULPRIDE (ANTIEMETIC) 5 MG/2ML IV SOLN: 10.0000 mg | Freq: Once | INTRAVENOUS | Status: DC | PRN

## 2024-11-04 MED ORDER — OXYMETAZOLINE HCL 0.05 % NA SOLN
NASAL | Status: DC | PRN
  Administered 2024-11-04: 1 via TOPICAL

## 2024-11-04 MED ORDER — 0.9 % SODIUM CHLORIDE (POUR BTL) OPTIME
TOPICAL | Status: DC | PRN
  Administered 2024-11-04: 1000 mL

## 2024-11-04 MED ORDER — LACTATED RINGERS IV SOLN: INTRAVENOUS | Status: DC

## 2024-11-04 MED ORDER — OXYCODONE HCL 5 MG PO TABS: 5.0000 mg | ORAL_TABLET | Freq: Once | ORAL | Status: DC | PRN

## 2024-11-04 MED ORDER — PHENYLEPHRINE HCL-NACL 20-0.9 MG/250ML-% IV SOLN
INTRAVENOUS | Status: DC | PRN
  Administered 2024-11-04: 80 ug via INTRAVENOUS
  Administered 2024-11-04: 160 ug via INTRAVENOUS
  Administered 2024-11-04 (×5): 80 ug via INTRAVENOUS

## 2024-11-04 MED ORDER — SODIUM CHLORIDE 0.9 % IR SOLN
Status: DC | PRN
  Administered 2024-11-04: 1000 mL

## 2024-11-04 MED ORDER — LIDOCAINE-EPINEPHRINE 2 %-1:100000 IJ SOLN
INTRAMUSCULAR | Status: DC | PRN
  Administered 2024-11-04: 11 mL via INTRADERMAL

## 2024-11-04 MED ORDER — ACETAMINOPHEN 10 MG/ML IV SOLN: 1000.0000 mg | Freq: Once | INTRAVENOUS | Status: DC | PRN

## 2024-11-04 MED ORDER — ONDANSETRON HCL 4 MG/2ML IJ SOLN
INTRAMUSCULAR | Status: DC | PRN
  Administered 2024-11-04: 4 mg via INTRAVENOUS

## 2024-11-04 MED ORDER — OXYCODONE-ACETAMINOPHEN 5-325 MG PO TABS: 1.0000 | ORAL_TABLET | ORAL | 0 refills | Status: AC | PRN

## 2024-11-04 MED ORDER — PROPOFOL 10 MG/ML IV BOLUS
INTRAVENOUS | Status: AC
  Filled 2024-11-04: qty 20

## 2024-11-04 MED ORDER — PROPOFOL 10 MG/ML IV BOLUS
INTRAVENOUS | Status: AC
Start: 2024-11-04 — End: 2024-11-04
  Filled 2024-11-04: qty 20

## 2024-11-04 MED ORDER — FENTANYL CITRATE (PF) 100 MCG/2ML IJ SOLN
INTRAMUSCULAR | Status: AC
  Filled 2024-11-04: qty 2

## 2024-11-04 MED ORDER — OXYCODONE HCL 5 MG/5ML PO SOLN: 5.0000 mg | Freq: Once | ORAL | Status: DC | PRN

## 2024-11-04 MED ORDER — MIDAZOLAM HCL (PF) 2 MG/2ML IJ SOLN
INTRAMUSCULAR | Status: DC | PRN
  Administered 2024-11-04: 2 mg via INTRAVENOUS

## 2024-11-04 MED ORDER — HYDROMORPHONE HCL 1 MG/ML IJ SOLN: 0.2500 mg | INTRAMUSCULAR | Status: DC | PRN

## 2024-11-04 MED ORDER — ONDANSETRON HCL 4 MG/2ML IJ SOLN: 4.0000 mg | Freq: Once | INTRAMUSCULAR | Status: DC | PRN

## 2024-11-04 MED ORDER — OXYMETAZOLINE HCL 0.05 % NA SOLN
NASAL | Status: DC | PRN
  Administered 2024-11-04: 2 via NASAL

## 2024-11-04 MED ORDER — DEXAMETHASONE SOD PHOSPHATE PF 10 MG/ML IJ SOLN
INTRAMUSCULAR | Status: DC | PRN
  Administered 2024-11-04: 10 mg via INTRAVENOUS

## 2024-11-04 MED ORDER — ORAL CARE MOUTH RINSE: 15.0000 mL | Freq: Once | OROMUCOSAL | Status: AC

## 2024-11-04 SURGICAL SUPPLY — 28 items
BAG COUNTER SPONGE SURGICOUNT (BAG) IMPLANT
BLADE SURG 15 STRL LF DISP TIS (BLADE) ×1 IMPLANT
BUR CROSS CUT FISSURE 1.6 (BURR) ×1 IMPLANT
BUR EGG ELITE 4.0 (BURR) IMPLANT
BUR SRG MED 1.2XXCUT FSSR (BURR) IMPLANT
CANISTER SUCTION 3000ML PPV (SUCTIONS) ×1 IMPLANT
COVER SURGICAL LIGHT HANDLE (MISCELLANEOUS) ×1 IMPLANT
GAUZE PACKING FOLDED 2 STR (GAUZE/BANDAGES/DRESSINGS) ×1 IMPLANT
GLOVE BIO SURGEON STRL SZ8 (GLOVE) ×1 IMPLANT
GOWN STRL REUS W/ TWL LRG LVL3 (GOWN DISPOSABLE) ×1 IMPLANT
GOWN STRL REUS W/ TWL XL LVL3 (GOWN DISPOSABLE) ×1 IMPLANT
IV 0.9% NACL 1000 ML (IV SOLUTION) ×1 IMPLANT
KIT BASIN OR (CUSTOM PROCEDURE TRAY) ×1 IMPLANT
KIT TURNOVER KIT B (KITS) ×1 IMPLANT
NDL HYPO 25GX1X1/2 BEV (NEEDLE) ×2 IMPLANT
NEEDLE HYPO 25GX1X1/2 BEV (NEEDLE) ×2 IMPLANT
PAD ARMBOARD POSITIONER FOAM (MISCELLANEOUS) ×1 IMPLANT
SLEEVE IRRIGATION ELITE 7 (MISCELLANEOUS) ×1 IMPLANT
SOLN 0.9% NACL POUR BTL 1000ML (IV SOLUTION) ×1 IMPLANT
SPIKE FLUID TRANSFER (MISCELLANEOUS) IMPLANT
SUT CHROMIC 3 0 SH 27 (SUTURE) IMPLANT
SUT CHROMIC 4 0 RB 1X27 (SUTURE) ×1 IMPLANT
SUT VIC AB 4-0 PS2 18 (SUTURE) IMPLANT
SYR BULB IRRIG 60ML STRL (SYRINGE) ×1 IMPLANT
SYR CONTROL 10ML LL (SYRINGE) ×1 IMPLANT
TRAY ENT MC OR (CUSTOM PROCEDURE TRAY) ×1 IMPLANT
TUBING IRRIGATION (MISCELLANEOUS) ×1 IMPLANT
YANKAUER SUCT BULB TIP NO VENT (SUCTIONS) ×1 IMPLANT

## 2024-11-04 NOTE — H&P (Signed)
 H&P documentation  -History and Physical Reviewed  -Patient has been re-examined  -No change in the plan of care  Megan Wang

## 2024-11-04 NOTE — Anesthesia Procedure Notes (Signed)
 Procedure Name: Intubation Date/Time: 11/04/2024 8:35 AM  Performed by: Jerrye Herring, CRNAPre-anesthesia Checklist: Patient identified, Emergency Drugs available, Suction available and Patient being monitored Patient Re-evaluated:Patient Re-evaluated prior to induction Oxygen Delivery Method: Circle System Utilized Preoxygenation: Pre-oxygenation with 100% oxygen Induction Type: IV induction Ventilation: Mask ventilation without difficulty Laryngoscope Size: 3 and Mac Grade View: Grade I Nasal Tubes: Nasal Rae and Nasal prep performed Number of attempts: 1 Placement Confirmation: ETT inserted through vocal cords under direct vision, positive ETCO2 and breath sounds checked- equal and bilateral Tube secured with: Tape Dental Injury: Teeth and Oropharynx as per pre-operative assessment

## 2024-11-04 NOTE — Op Note (Unsigned)
 NAME: Megan Wang, Megan Wang MEDICAL RECORD NO: [id number] ACCOUNT NO: [id number] DATE OF BIRTH: 10/30/1961 FACILITY[facility] LOCATION: [facility] PHYSICIAN: Glendia EMERSON Primrose, DDS  Operative Report   DATE OF PROCEDURE: 11/04/2024  PREOPERATIVE DIAGNOSES:   1.  Maxillary atrophy. 2.  Irregular maxillary alveolar contour.  POSTOPERATIVE DIAGNOSES:   1.  Maxillary atrophy. 2.  Irregular maxillary alveolar contour.  PROCEDURE:   1.  Bilateral maxillary alveoloplasty. 2.  Right maxillary vestibuloplasty.  SURGEON:  Glendia EMERSON Primrose, DDS  ANESTHESIA:  General nasal intubation, Dr. Tilford attending.  DESCRIPTION OF PROCEDURE:  The patient was taken to the operating room, placed on the table in supine position.  General anesthesia was administered.  Nasal endotracheal tube was placed and secured.  The eyes were protected.  The patient was draped for  surgery.  Timeout was performed.  The posterior pharynx was suctioned and a throat pack was placed.  A 2% lidocaine  1:100,000 epinephrine  was infiltrated buccally and palatally in the right and left maxilla.  Attention was turned to the left maxilla first.  The 15 blade was used to make an incision beginning of the tuberosity region and carried forward towards the midline.  The tissue was reflected and the irregular bony contour was exposed.  Then the Stryker  handpiece with an egg burr was used to recontour the bone and it was further smoothed with a bone file and then irrigated and closed with 3-0 chromic.  The right side was operated next.  The 15 blade was used to make an incision in the loose alveolar mucosa approximately 1 cm above the mucogingival junction beginning at the tuberosity region and carried forward to approximately the canine region.  Then, using Kazanjian technique,  a vertical incision was made anteriorly at the canine region and the incision continued on the crest of the right maxillary alveolus to the midline.  Then the 15 blade was  used to dissect the posterior portion of the alveolar mucosa until the bone level  was reached separating it from the submucosal tissue.  Then the submucosal tissue, which was in the buccal aspect of the incision was sutured deeply with 4-0 chromic sutures.  The free portion of the palatal tissue that was buckling location was then  sutured deeply to provide new vestibular depth and adherence of the tissue to the lateral wall of the right alveolus.  Then the remainder of the incision was closed with 4-0 chromic and 4-0 Vicryl.  In the anterior region from the canine to the midline the Stryker handpiece was used after dissecting down to bone to reduce the bone contour and it was further smoothed with a bone file.  Then this area was irrigated and closed with 3-0 chromic.  The oral cavity is irrigated and suctioned.  The throat pack was removed.  The patient was left in care of anesthesia for extubation and transport to recovery with plans for discharge home through day surgery.  ESTIMATED BLOOD LOSS:  Minimum.  COMPLICATIONS:  None.  SPECIMENS:  None.   PUS D: 11/04/2024 9:41:55 am T: 11/04/2024 9:51:00 am  JOB: [id number]/ [id number]

## 2024-11-04 NOTE — Op Note (Signed)
 11/04/2024  9:37 AM  PATIENT:  Megan Wang  63 y.o. female  PRE-OPERATIVE DIAGNOSIS:  MAXILLARY ATROPHY, IRREGULAR MAXILLARY ALVEOLAR CONTOUR   POST-OPERATIVE DIAGNOSIS:  SAME  PROCEDURE:  Procedure(s): BILATERAL MAXILLARY ALVEOLOPLASTY AND RIGHT MAXILLARY VESTIBULOPLASTY  SURGEON:  Surgeon(s): Sheryle Hamilton, DMD  ANESTHESIA:   local and general  EBL:  minimal  DRAINS: none   SPECIMEN:  No Specimen  COUNTS:  YES  PLAN OF CARE: Discharge to home after PACU  PATIENT DISPOSITION:  PACU - hemodynamically stable.   PROCEDURE DETAILS: Dictation #[id number]  Hamilton EMERSON Sheryle, DMD 11/04/2024 9:37 AM

## 2024-11-04 NOTE — Transfer of Care (Signed)
 Immediate Anesthesia Transfer of Care Note  Patient: Megan Wang  Procedure(s) Performed: MAXILLARY ALVEOLOPLASTY AND VESTIBULOPLASTY  Patient Location: PACU  Anesthesia Type:General  Level of Consciousness: Awake and Alert  Airway & Oxygen Therapy: Patient Spontanous Breathing  Post-op Assessment: Report given to RN, Post -op Vital signs reviewed and stable, and Patient moving all extremities X 4  Post vital signs: Reviewed  Last Vitals:  Vitals Value Taken Time  BP 144/95 11/04/24 09:47  Temp    Pulse 94 11/04/24 09:50  Resp 18 11/04/24 09:50  SpO2 92 % 11/04/24 09:50  Vitals shown include unfiled device data.  Last Pain:  Vitals:   11/04/24 0710  TempSrc:   PainSc: 0-No pain         Complications: There were no known notable events for this encounter.

## 2024-11-04 NOTE — Anesthesia Postprocedure Evaluation (Signed)
 Anesthesia Post Note  Patient: Megan Wang  Procedure(s) Performed: MAXILLARY ALVEOLOPLASTY AND VESTIBULOPLASTY     Patient location during evaluation: PACU Anesthesia Type: General Level of consciousness: awake and alert Pain management: pain level controlled Vital Signs Assessment: post-procedure vital signs reviewed and stable Respiratory status: spontaneous breathing, nonlabored ventilation, respiratory function stable and patient connected to nasal cannula oxygen Cardiovascular status: blood pressure returned to baseline and stable Postop Assessment: no apparent nausea or vomiting Anesthetic complications: no   There were no known notable events for this encounter.  Last Vitals:  Vitals:   11/04/24 1015 11/04/24 1030  BP: 132/72 117/76  Pulse: 92 92  Resp: 17 19  Temp:  36.5 C  SpO2: 93% 94%    Last Pain:  Vitals:   11/04/24 1030  TempSrc:   PainSc: 0-No pain                 Garnette DELENA Gab

## 2024-11-05 ENCOUNTER — Encounter (HOSPITAL_COMMUNITY): Payer: Self-pay | Admitting: Oral Surgery
